# Patient Record
Sex: Female | Born: 2000 | Race: Black or African American | Hispanic: No | Marital: Single | Smoking: Never smoker
Health system: Southern US, Community
[De-identification: ages and names within clinical notes are randomized; demographics above are authoritative.]

## PROBLEM LIST (undated history)

## (undated) DIAGNOSIS — F32A Depression, unspecified: Secondary | ICD-10-CM

## (undated) DIAGNOSIS — E669 Obesity, unspecified: Secondary | ICD-10-CM

## (undated) DIAGNOSIS — J302 Other seasonal allergic rhinitis: Secondary | ICD-10-CM

## (undated) DIAGNOSIS — E119 Type 2 diabetes mellitus without complications: Secondary | ICD-10-CM

## (undated) DIAGNOSIS — F329 Major depressive disorder, single episode, unspecified: Secondary | ICD-10-CM

## (undated) DIAGNOSIS — L0292 Furuncle, unspecified: Secondary | ICD-10-CM

## (undated) HISTORY — DX: Type 2 diabetes mellitus without complications: E11.9

## (undated) HISTORY — DX: Other seasonal allergic rhinitis: J30.2

## (undated) HISTORY — DX: Obesity, unspecified: E66.9

---
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
## 2020-07-21 ENCOUNTER — Emergency Department (HOSPITAL_COMMUNITY)
Admission: EM | Admit: 2020-07-21 | Discharge: 2020-07-21 | Disposition: A | Discharge status: Home / Self Care | Payer: Medicaid Other | Attending: Emergency Medicine | Admitting: Emergency Medicine

## 2020-07-21 ENCOUNTER — Other Ambulatory Visit: Payer: Self-pay

## 2020-07-21 DIAGNOSIS — Z7722 Contact with and (suspected) exposure to environmental tobacco smoke (acute) (chronic): Secondary | ICD-10-CM | POA: Diagnosis not present

## 2020-07-21 DIAGNOSIS — K0889 Other specified disorders of teeth and supporting structures: Secondary | ICD-10-CM | POA: Diagnosis present

## 2020-07-21 DIAGNOSIS — E1169 Type 2 diabetes mellitus with other specified complication: Secondary | ICD-10-CM | POA: Diagnosis not present

## 2020-07-21 DIAGNOSIS — E669 Obesity, unspecified: Secondary | ICD-10-CM | POA: Diagnosis not present

## 2020-07-21 MED ORDER — HYDROCODONE-ACETAMINOPHEN 5-325 MG PO TABS: 2.0000 | ORAL_TABLET | Freq: Four times a day (QID) | ORAL | 0 refills | Status: AC | PRN

## 2020-07-21 MED ORDER — IBUPROFEN 800 MG PO TABS: 800.0000 mg | ORAL_TABLET | Freq: Three times a day (TID) | ORAL | 0 refills | Status: AC | PRN

## 2020-07-21 MED ORDER — AMOXICILLIN 500 MG PO CAPS: 500.0000 mg | ORAL_CAPSULE | Freq: Three times a day (TID) | ORAL | 0 refills | Status: DC

## 2020-07-21 MED ORDER — IBUPROFEN 800 MG PO TABS
800.0000 mg | ORAL_TABLET | Freq: Once | ORAL | Status: AC
Start: 2020-07-21 — End: 2020-07-21
  Administered 2020-07-21: 800 mg via ORAL
  Filled 2020-07-21: qty 1

## 2020-07-21 MED ORDER — HYDROCODONE-ACETAMINOPHEN 5-325 MG PO TABS
1.0000 | ORAL_TABLET | Freq: Once | ORAL | Status: AC
Start: 2020-07-21 — End: 2020-07-21
  Administered 2020-07-21: 1 via ORAL
  Filled 2020-07-21: qty 1

## 2020-07-21 NOTE — Discharge Instructions (Signed)
Return if any problems.

## 2020-07-21 NOTE — ED Triage Notes (Signed)
Patient presents to ED with oral swelling to right side of face. Started at 2 am. Pain is 10/10

## 2020-07-21 NOTE — ED Provider Notes (Signed)
[facility]-EMERGENCY DEPT Provider Note   CSN: [id number] Arrival date & time: 07/21/20  1135     History Chief Complaint  Patient presents with  . Dental Pain    Kendra Williams is a 20 y.o. female.  The history is provided by the patient. No language interpreter was used.  Dental Pain Location:  Upper Quality:  Aching Severity:  Moderate Onset quality:  Gradual Timing:  Constant Progression:  Worsening Chronicity:  New Relieved by:  Nothing Pt complains of pain on the right side of her face.       Past Medical History:  Diagnosis Date  . Boil   . Depression   . Diabetes mellitus without complication (HCC)   . Obesity   . Seasonal allergies     Patient Active Problem List   Diagnosis Date Noted  . Hallucinations 05/28/2016  . MDD (major depressive disorder) 05/27/2016  . Suicide ideation   . MDD (major depressive disorder), recurrent episode, severe (HCC) 04/04/2016  . Elevated hemoglobin A1c 08/07/2015  . Acanthosis 08/07/2015  . Hypovitaminosis D 08/07/2015  . Morbid childhood obesity with BMI greater than 99th percentile for age [facility]) 08/07/2015    No past surgical history on file.   OB History   No obstetric history on file.     Family History  Problem Relation Age of Onset  . Hypertension Paternal Grandmother   . Obesity Father   . Hypertension Father   . Diabetes Father   . Gout Father   . Cancer Paternal Grandfather   . Diabetes Paternal Grandfather   . Diabetes Paternal Uncle   . Diabetes Maternal Grandmother     Social History   Tobacco Use  . Smoking status: Passive Smoke Exposure - Never Smoker  . Smokeless tobacco: Never Used  Substance Use Topics  . Alcohol use: No    Comment: denies  . Drug use: No    Comment: denies    Home Medications Prior to Admission medications   Medication Sig Start Date End Date Taking? Authorizing Provider  amoxicillin (AMOXIL) 500 MG capsule Take 1 capsule (500 mg total)  by mouth 3 (three) times daily. 07/21/20  Yes Elson Areas, PA-C  HYDROcodone-acetaminophen (NORCO/VICODIN) 5-325 MG tablet Take 2 tablets by mouth every 6 (six) hours as needed for moderate pain. 07/21/20 07/21/21 Yes Elson Areas, PA-C  ibuprofen (ADVIL) 800 MG tablet Take 1 tablet (800 mg total) by mouth every 8 (eight) hours as needed. 07/21/20  Yes Elson Areas, PA-C  CLINDAMAX 1 % gel Apply topically 2 (two) times daily. Apply topically twice a day to affected area 04/10/16   Denzil Magnuson, NP  lurasidone (LATUDA) 40 MG TABS tablet Take 1 tablet (40 mg total) by mouth daily with breakfast. 05/31/16   Maryagnes Amos, FNP  sertraline (ZOLOFT) 25 MG tablet Take 3 tablets (75 mg total) by mouth daily. 05/31/16   Starkes-Perry, Juel Burrow, FNP  traZODone (DESYREL) 50 MG tablet Take 1 tablet (50 mg total) by mouth at bedtime as needed for sleep. 05/30/16   Starkes-Perry, Juel Burrow, FNP    Allergies    Peach [prunus persica]  Review of Systems   Review of Systems  All other systems reviewed and are negative.   Physical Exam Updated Vital Signs BP 135/79   Pulse 73   Temp 98.2 F (36.8 C) (Oral)   Resp 18   Ht 5\' 7"  (1.702 m)   SpO2 97%   Physical Exam Vitals  and nursing note reviewed.  Constitutional:      Appearance: Normal appearance.  HENT:     Mouth/Throat:     Mouth: Mucous membranes are moist.  Eyes:     Pupils: Pupils are equal, round, and reactive to light.  Cardiovascular:     Rate and Rhythm: Normal rate.  Musculoskeletal:        General: Normal range of motion.  Skin:    General: Skin is warm.  Neurological:     General: No focal deficit present.     Mental Status: She is alert.  Psychiatric:        Mood and Affect: Mood normal.     ED Results / Procedures / Treatments   Labs (all labs ordered are listed, but only abnormal results are displayed) Labs Reviewed - No data to display  EKG None  Radiology No results found.  Procedures Procedures    Medications Ordered in ED Medications  ibuprofen (ADVIL) tablet 800 mg (800 mg Oral Given 07/21/20 1251)  HYDROcodone-acetaminophen (NORCO/VICODIN) 5-325 MG per tablet 1 tablet (1 tablet Oral Given 07/21/20 1251)    ED Course  I have reviewed the triage vital signs and the nursing notes.  Pertinent labs & imaging results that were available during my care of the patient were reviewed by me and considered in my medical decision making (see chart for details).    MDM Rules/Calculators/A&P                          MDM:  Pt reports pain improved with hydrocodone and ibuprofen.  Pt given rx for dental evaluation  Final Clinical Impression(s) / ED Diagnoses Final diagnoses:  Pain, dental   An After Visit Summary was printed and given to the patient. Rx / DC Orders ED Discharge Orders         Ordered    amoxicillin (AMOXIL) 500 MG capsule  3 times daily        07/21/20 1329    HYDROcodone-acetaminophen (NORCO/VICODIN) 5-325 MG tablet  Every 6 hours PRN        07/21/20 1329    ibuprofen (ADVIL) 800 MG tablet  Every 8 hours PRN        07/21/20 1329        An After Visit Summary was printed and given to the patient.    Osie Cheeks 07/21/20 1430    Gerhard Munch, MD 07/23/20 2330

## 2020-09-13 ENCOUNTER — Other Ambulatory Visit: Payer: Self-pay

## 2020-09-13 ENCOUNTER — Emergency Department (HOSPITAL_COMMUNITY)
Admission: EM | Admit: 2020-09-13 | Discharge: 2020-09-13 | Disposition: A | Discharge status: Home / Self Care | Payer: Medicaid Other | Attending: Emergency Medicine | Admitting: Emergency Medicine

## 2020-09-13 ENCOUNTER — Encounter (HOSPITAL_COMMUNITY): Payer: Self-pay

## 2020-09-13 DIAGNOSIS — J029 Acute pharyngitis, unspecified: Secondary | ICD-10-CM | POA: Diagnosis present

## 2020-09-13 DIAGNOSIS — E119 Type 2 diabetes mellitus without complications: Secondary | ICD-10-CM | POA: Diagnosis not present

## 2020-09-13 DIAGNOSIS — Z87891 Personal history of nicotine dependence: Secondary | ICD-10-CM | POA: Diagnosis not present

## 2020-09-13 DIAGNOSIS — R519 Headache, unspecified: Secondary | ICD-10-CM | POA: Insufficient documentation

## 2020-09-13 LAB — GROUP A STREP BY PCR: Group A Strep by PCR: NOT DETECTED

## 2020-09-13 MED ORDER — ACETAMINOPHEN 500 MG PO TABS
1000.0000 mg | ORAL_TABLET | Freq: Once | ORAL | Status: AC
  Administered 2020-09-13: 1000 mg via ORAL
  Filled 2020-09-13: qty 2

## 2020-09-13 NOTE — ED Triage Notes (Signed)
Patient c/o migraine, sore throat x 2 days and states she vomited for an hour yesterday, but none today.

## 2020-09-13 NOTE — Discharge Instructions (Addendum)
  You were evaluated in the Emergency Department and after careful evaluation, we did not find any emergent condition requiring admission or further testing in the hospital.   Your exam/testing today was overall reassuring.  Symptoms seem to be due to a viral cold sore throat.  As we discussed I want you to gargle salt water, take Tylenol every 6-8 hours as directed on the bottle for the next couple of days.  If you develop any new or worsening concerning symptoms as we discussed such as difficulty swallowing, drooling, difficulty opening your mouth or trouble breathing please come back to the emergency department.  Please follow-up with your primary care doctor in the next couple of days for a recheck of your symptoms or not getting better. Please return to the Emergency Department if you experience any worsening of your condition.  Thank you for allowing [place] to be a part of your care. Please speak to your pharmacist about any new medications prescribed today in regards to side effects or interactions with other medications.

## 2020-09-13 NOTE — ED Provider Notes (Signed)
[facility]-EMERGENCY DEPT Provider Note   CSN: [id number] Arrival date & time: 09/13/20  1119     History Chief Complaint  Patient presents with   Sore Throat   Migraine    Curt JewsCarla Aber is a 20 y.o. female with prior past medical history of diabetes and obesity who presents emerged department today for headache and sore throat for the past 3 days.  Patient states that her sore throat and headache are mild, states that her headache is a 4 out of 10, bilaterally.  Patient states that she has not taken anything for this, does have a history of migraines.  Patient states that she did vomit 1 time yesterday, has not felt nauseous since.  No vomiting today.  No diarrhea.  Denies any cough, fevers, myalgias, back pain, abdominal pain, chance of pregnancy.  Patient states that her best friend was just diagnosed with COVID 2 days ago, states that she is tested 3 times since then, PCR's which have been negative.  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  Denies any drooling, trismus, muffled voice, difficulty swallowing.  Patient is eating and drinking normally.  Denies any shortness of breath or chest pain.  Denies any facial pain or ear pain.Marland Kitchen.  HPI     Past Medical History:  Diagnosis Date   Boil    Depression    Diabetes mellitus without complication (HCC)    Obesity    Seasonal allergies     Patient Active Problem List   Diagnosis Date Noted   Hallucinations 05/28/2016   MDD (major depressive disorder) 05/27/2016   Suicide ideation    MDD (major depressive disorder), recurrent episode, severe (HCC) 04/04/2016   Elevated hemoglobin A1c 08/07/2015   Acanthosis 08/07/2015   Hypovitaminosis D 08/07/2015   Morbid childhood obesity with BMI greater than 99th percentile for age [facility](HCC) 08/07/2015    History reviewed. No pertinent surgical history.   OB History   No obstetric history on file.     Family History   Problem Relation Age of Onset   Hypertension Paternal Grandmother    Obesity Father    Hypertension Father    Diabetes Father    Gout Father    Cancer Paternal Grandfather    Diabetes Paternal Grandfather    Diabetes Paternal Uncle    Diabetes Maternal Grandmother     Social History   Tobacco Use   Smoking status: Never    Passive exposure: Yes   Smokeless tobacco: Never  Vaping Use   Vaping Use: Former  Substance Use Topics   Alcohol use: No    Comment: denies   Drug use: No    Comment: denies    Home Medications Prior to Admission medications   Medication Sig Start Date End Date Taking? Authorizing Provider  amoxicillin (AMOXIL) 500 MG capsule Take 1 capsule (500 mg total) by mouth 3 (three) times daily. 07/21/20   Elson AreasSofia, Leslie K, PA-C  CLINDAMAX 1 % gel Apply topically 2 (two) times daily. Apply topically twice a day to affected area 04/10/16   Denzil Magnusonhomas, Lashunda, NP  HYDROcodone-acetaminophen (NORCO/VICODIN) 5-325 MG tablet Take 2 tablets by mouth every 6 (six) hours as needed for moderate pain. 07/21/20 07/21/21  Elson AreasSofia, Leslie K, PA-C  ibuprofen (ADVIL) 800 MG tablet Take 1 tablet (800 mg total) by mouth every 8 (eight) hours as needed. 07/21/20   Elson AreasSofia, Leslie K, PA-C  lurasidone (LATUDA) 40 MG TABS tablet Take  1 tablet (40 mg total) by mouth daily with breakfast. 05/31/16   Maryagnes Amos, FNP  sertraline (ZOLOFT) 25 MG tablet Take 3 tablets (75 mg total) by mouth daily. 05/31/16   Starkes-Perry, Juel Burrow, FNP  traZODone (DESYREL) 50 MG tablet Take 1 tablet (50 mg total) by mouth at bedtime as needed for sleep. 05/30/16   Maryagnes Amos, FNP    Allergies    Other and Peach [prunus persica]  Review of Systems   Review of Systems  Constitutional:  Negative for chills, diaphoresis, fatigue and fever.  HENT:  Positive for sore throat. Negative for congestion, dental problem, drooling, ear discharge, ear pain, facial swelling, hearing loss, mouth sores, nosebleeds,  postnasal drip, sinus pressure and trouble swallowing.   Eyes:  Negative for pain and visual disturbance.  Respiratory:  Negative for cough, shortness of breath and wheezing.   Cardiovascular:  Negative for chest pain, palpitations and leg swelling.  Gastrointestinal:  Negative for abdominal distention, abdominal pain, diarrhea, nausea and vomiting.  Genitourinary:  Negative for difficulty urinating.  Musculoskeletal:  Negative for back pain, neck pain and neck stiffness.  Skin:  Negative for pallor.  Neurological:  Positive for headaches. Negative for dizziness, speech difficulty and weakness.  Psychiatric/Behavioral:  Negative for confusion.    Physical Exam Updated Vital Signs BP (!) 152/96 (BP Location: Right Arm)   Pulse (!) 106   Temp 99.4 F (37.4 C) (Oral)   Resp 18   Ht 5\' 6"  (1.676 m)   Wt 118.1 kg   LMP 08/23/2020 (Exact Date)   SpO2 97%   BMI 42.03 kg/m   Physical Exam Constitutional:      General: She is not in acute distress.    Appearance: Normal appearance. She is not ill-appearing, toxic-appearing or diaphoretic.     Comments: Patient without acute respiratory stress.  Patient is sitting comfortably in bed, no tripoding, use of accessory muscles.  Patient is speaking to me in full sentences.  Handling secretions well.  HENT:     Head: Normocephalic and atraumatic.     Jaw: There is normal jaw occlusion. No trismus, swelling or malocclusion.     Nose: No congestion or rhinorrhea.     Right Sinus: No maxillary sinus tenderness or frontal sinus tenderness.     Left Sinus: No maxillary sinus tenderness or frontal sinus tenderness.     Mouth/Throat:     Mouth: Mucous membranes are moist. No oral lesions.     Dentition: Normal dentition.     Tongue: No lesions.     Palate: No mass and lesions.     Pharynx: Oropharynx is clear. Uvula midline. No pharyngeal swelling, oropharyngeal exudate, posterior oropharyngeal erythema or uvula swelling.     Tonsils: No tonsillar  exudate or tonsillar abscesses. 1+ on the right. 1+ on the left.     Comments: Patient without tonsillar enlargement or exudate.  No signs of peritonsillar abscess, palate without any tenderness or masses palpated.  No swelling under the tongue, uvula is midline without any inflammation. Eyes:     General: No visual field deficit.       Right eye: No discharge.        Left eye: No discharge.     Extraocular Movements: Extraocular movements intact.     Conjunctiva/sclera: Conjunctivae normal.     Pupils: Pupils are equal, round, and reactive to light.  Cardiovascular:     Rate and Rhythm: Normal rate and regular rhythm.  Pulses: Normal pulses.     Heart sounds: Normal heart sounds. No murmur heard.   No friction rub. No gallop.  Pulmonary:     Effort: Pulmonary effort is normal. No respiratory distress.     Breath sounds: Normal breath sounds. No stridor. No wheezing, rhonchi or rales.  Chest:     Chest wall: No tenderness.  Abdominal:     General: Abdomen is flat. Bowel sounds are normal. There is no distension.     Palpations: Abdomen is soft.     Tenderness: There is no abdominal tenderness. There is no right CVA tenderness or left CVA tenderness.  Musculoskeletal:        General: No swelling or tenderness. Normal range of motion.     Cervical back: Normal range of motion. No rigidity or tenderness.     Right lower leg: No edema.     Left lower leg: No edema.  Lymphadenopathy:     Cervical: No cervical adenopathy.  Skin:    General: Skin is warm and dry.     Capillary Refill: Capillary refill takes less than 2 seconds.     Findings: No erythema or rash.  Neurological:     General: No focal deficit present.     Mental Status: She is alert and oriented to person, place, and time.     Cranial Nerves: Cranial nerves are intact. No cranial nerve deficit or facial asymmetry.     Motor: Motor function is intact. No weakness.     Coordination: Coordination is intact.     Gait:  Gait is intact. Gait normal.  Psychiatric:        Mood and Affect: Mood normal.    ED Results / Procedures / Treatments   Labs (all labs ordered are listed, but only abnormal results are displayed) Labs Reviewed  GROUP A STREP BY PCR    EKG None  Radiology No results found.  Procedures Procedures   Medications Ordered in ED Medications  acetaminophen (TYLENOL) tablet 1,000 mg (1,000 mg Oral Given 09/13/20 1232)    ED Course  I have reviewed the triage vital signs and the nursing notes.  Pertinent labs & imaging results that were available during my care of the patient were reviewed by me and considered in my medical decision making (see chart for details).    MDM Rules/Calculators/A&P                          Patient presents to the emerge department today for sore throat and headache.  Headache is mild, no red flag symptoms.  Most likely related to sore throat and upper respiratory infection.  Patient's throat looks benign, patient is very well-appearing.  No concerns for peritonsillar abscess, Ludwick's or deep space infection.  Patient rejecting IM Toradol, wants something p.o. for headache and sore throat.  Does not want to be tested for COVID since she is been tested 3 times.  Will test for strep at this time and give Tylenol and reevaluate.  Upon evaluation patient states that she feels much better, tachycardia has resolved.  Patient's strep test is negative.  Patient most likely has viral pharyngitis, did discuss this with patient, symptomatic treatment discussed.  Patient agreeable with plan, to be discharged at this time.  Did also discussed blood pressre control with patient, states that she will follow-up with PCP.  Doubt need for further emergent work up at this time. I explained the diagnosis and have  given explicit precautions to return to the ER including for any other new or worsening symptoms. The patient understands and accepts the medical plan as it's been  dictated and I have answered their questions. Discharge instructions concerning home care and prescriptions have been given. The patient is STABLE and is discharged to home in good condition.   Final Clinical Impression(s) / ED Diagnoses Final diagnoses:  Viral pharyngitis    Rx / DC Orders ED Discharge Orders     None        Farrel Gordon, PA-C 09/13/20 1334    Tilden Fossa, MD 09/15/20 [phone]

## 2020-10-02 ENCOUNTER — Ambulatory Visit
Admission: EM | Admit: 2020-10-02 | Discharge: 2020-10-02 | Disposition: A | Discharge status: Home / Self Care | Payer: Medicaid Other | Attending: Urgent Care | Admitting: Urgent Care

## 2020-10-02 ENCOUNTER — Encounter: Payer: Self-pay | Admitting: Urgent Care

## 2020-10-02 ENCOUNTER — Other Ambulatory Visit: Payer: Self-pay

## 2020-10-02 DIAGNOSIS — R112 Nausea with vomiting, unspecified: Secondary | ICD-10-CM

## 2020-10-02 DIAGNOSIS — N946 Dysmenorrhea, unspecified: Secondary | ICD-10-CM | POA: Diagnosis not present

## 2020-10-02 DIAGNOSIS — G43809 Other migraine, not intractable, without status migrainosus: Secondary | ICD-10-CM | POA: Diagnosis not present

## 2020-10-02 DIAGNOSIS — Z8742 Personal history of other diseases of the female genital tract: Secondary | ICD-10-CM

## 2020-10-02 DIAGNOSIS — B349 Viral infection, unspecified: Secondary | ICD-10-CM | POA: Diagnosis not present

## 2020-10-02 LAB — POCT URINE PREGNANCY: Preg Test, Ur: NEGATIVE

## 2020-10-02 MED ORDER — EXCEDRIN MIGRAINE 250-250-65 MG PO TABS: 1.0000 | ORAL_TABLET | Freq: Four times a day (QID) | ORAL | 0 refills | Status: AC | PRN

## 2020-10-02 MED ORDER — ONDANSETRON 8 MG PO TBDP
8.0000 mg | ORAL_TABLET | Freq: Once | ORAL | Status: AC
  Administered 2020-10-02: 8 mg via ORAL

## 2020-10-02 MED ORDER — ONDANSETRON 8 MG PO TBDP: 8.0000 mg | ORAL_TABLET | Freq: Three times a day (TID) | ORAL | 0 refills | Status: AC | PRN

## 2020-10-02 MED ORDER — KETOROLAC TROMETHAMINE 60 MG/2ML IM SOLN
60.0000 mg | Freq: Once | INTRAMUSCULAR | Status: AC
  Administered 2020-10-02: 60 mg via INTRAMUSCULAR

## 2020-10-02 NOTE — ED Provider Notes (Signed)
[facility]   MRN: [id number] DOB: 2000/07/05  Subjective:   Kendra Williams is a 20 y.o. female presenting for 2-day history of acute onset left-sided headache, nausea, vomiting, diarrhea, photophobia, malaise and fatigue.  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  She tested positive today.  Had a negative test yesterday but since she continued to feel ill, she did repeat test today.  She is COVID vaccinated with the booster.  Patient has a history of dysmenorrhea and irregular cycles.  She stopped taking her birth control months ago because of problems with breakthrough bleeding.  Has not seen a gynecologist since then.  She did have 2 cycles within the past month, is currently on her second one.  Reports that she has been bleeding profusely.  Prefers to see a gynecologist.  Regarding her headache, has typically not used any medications for migraines.  She does have a history of allergies but does not take anything consistently for this either.  Denies runny or stuffy nose, chest pain, shortness of breath, wheezing.  No recent antibiotics in the past 2 weeks.  No recent hospitalizations, long distance travel.   Current Facility-Administered Medications:    ketorolac (TORADOL) injection 60 mg, 60 mg, Intramuscular, Once, Wallis Bamberg, PA-C  Current Outpatient Medications:    HYDROcodone-acetaminophen (NORCO/VICODIN) 5-325 MG tablet, Take 2 tablets by mouth every 6 (six) hours as needed for moderate pain., Disp: 10 tablet, Rfl: 0   ibuprofen (ADVIL) 800 MG tablet, Take 1 tablet (800 mg total) by mouth every 8 (eight) hours as needed., Disp: 30 tablet, Rfl: 0   lurasidone (LATUDA) 40 MG TABS tablet, Take 1 tablet (40 mg total) by mouth daily with breakfast., Disp: 30 tablet, Rfl: 0   sertraline (ZOLOFT) 25 MG tablet, Take 3 tablets (75 mg total) by mouth daily., Disp: 30 tablet, Rfl: 0   traZODone (DESYREL) 50 MG tablet, Take 1 tablet (50 mg total) by  mouth at bedtime as needed for sleep., Disp: 30 tablet, Rfl: 0   Allergies  Allergen Reactions   Other     Peanuts, cats   Peach [Prunus Persica] Hives    Past Medical History:  Diagnosis Date   Boil    Depression    Diabetes mellitus without complication (HCC)    Obesity    Seasonal allergies      History reviewed. No pertinent surgical history.  Family History  Problem Relation Age of Onset   Hypertension Paternal Grandmother    Obesity Father    Hypertension Father    Diabetes Father    Gout Father    Cancer Paternal Grandfather    Diabetes Paternal Grandfather    Diabetes Paternal Uncle    Diabetes Maternal Grandmother     Social History   Tobacco Use   Smoking status: Never    Passive exposure: Yes   Smokeless tobacco: Never  Vaping Use   Vaping Use: Some days  Substance Use Topics   Alcohol use: No    Comment: denies   Drug use: No    Comment: denies    ROS   Objective:   Vitals: BP 116/81 (BP Location: Left Arm)   Pulse 70   Temp 98.5 F (36.9 C) (Oral)   Resp 20   SpO2 98%   Physical Exam Constitutional:      General: She is not in acute distress.    Appearance: Normal appearance. She is well-developed. She is not ill-appearing, toxic-appearing  or diaphoretic.  HENT:     Head: Normocephalic and atraumatic.     Right Ear: Tympanic membrane, ear canal and external ear normal. No drainage or tenderness. No middle ear effusion. There is no impacted cerumen. Tympanic membrane is not erythematous.     Left Ear: Tympanic membrane, ear canal and external ear normal. No drainage or tenderness.  No middle ear effusion. There is no impacted cerumen. Tympanic membrane is not erythematous.     Nose: Nose normal. No congestion or rhinorrhea.     Mouth/Throat:     Mouth: Mucous membranes are moist. No oral lesions.     Pharynx: Oropharynx is clear. No pharyngeal swelling, oropharyngeal exudate, posterior oropharyngeal erythema or uvula swelling.      Tonsils: No tonsillar exudate or tonsillar abscesses.  Eyes:     General: No scleral icterus.       Right eye: No discharge.        Left eye: No discharge.     Extraocular Movements: Extraocular movements intact.     Right eye: Normal extraocular motion.     Left eye: Normal extraocular motion.     Conjunctiva/sclera: Conjunctivae normal.     Pupils: Pupils are equal, round, and reactive to light.  Cardiovascular:     Rate and Rhythm: Normal rate and regular rhythm.     Pulses: Normal pulses.     Heart sounds: Normal heart sounds. No murmur heard.   No friction rub. No gallop.  Pulmonary:     Effort: Pulmonary effort is normal. No respiratory distress.     Breath sounds: Normal breath sounds. No stridor. No wheezing, rhonchi or rales.  Musculoskeletal:     Cervical back: Normal range of motion and neck supple.  Lymphadenopathy:     Cervical: No cervical adenopathy.  Skin:    General: Skin is warm and dry.     Findings: No rash.  Neurological:     General: No focal deficit present.     Mental Status: She is alert and oriented to person, place, and time.     Cranial Nerves: No cranial nerve deficit.     Motor: No weakness.     Coordination: Coordination normal.     Gait: Gait normal.     Deep Tendon Reflexes: Reflexes normal.  Psychiatric:        Mood and Affect: Mood normal.        Behavior: Behavior normal.        Thought Content: Thought content normal.        Judgment: Judgment normal.    Results for orders placed or performed during the hospital encounter of 10/02/20 (from the past 24 hour(s))  POCT urine pregnancy     Status: None   Collection Time: 10/02/20  9:25 AM  Result Value Ref Range   Preg Test, Ur Negative Negative   IM Toradol, p.o. Zofran in clinic.   Assessment and Plan :   PDMP not reviewed this encounter.  1. Viral syndrome   2. Other migraine without status migrainosus, not intractable   3. Nausea vomiting and diarrhea     Will manage for  viral illness such as viral URI, viral syndrome, viral rhinitis, COVID-19. Counseled patient on nature of COVID-19 including modes of transmission, diagnostic testing, management and supportive care.  Offered scripts for symptomatic relief. COVID 19 testing is pending.  Patient would be a good candidate to receive COVID antivirals given her medical history.  She would be able to tolerate  it with a creatinine level of 0.92 from 07/26/2020.  Recommended Excedrin for migraines, restart allergy medications.  Patient declined any treatment for her dysmenorrhea, provide her with information to a gynecologist to establish care and follow-up.  Counseled patient on potential for adverse effects with medications prescribed/recommended today, ER and return-to-clinic precautions discussed, patient verbalized understanding.     Wallis Bamberg, [place] 10/02/20 [phone]

## 2020-10-02 NOTE — ED Triage Notes (Signed)
Pt c/o n/v/d, headache and vaginal bleeding. States had a positive covid test at work.

## 2020-10-03 LAB — NOVEL CORONAVIRUS, NAA: SARS-CoV-2, NAA: NOT DETECTED

## 2020-10-03 LAB — SARS-COV-2, NAA 2 DAY TAT

## 2020-12-11 ENCOUNTER — Telehealth: Payer: Self-pay

## 2021-02-18 ENCOUNTER — Emergency Department (HOSPITAL_COMMUNITY)
Admission: EM | Admit: 2021-02-18 | Discharge: 2021-02-18 | Disposition: A | Discharge status: Home / Self Care | Payer: Medicaid Other | Attending: Emergency Medicine | Admitting: Emergency Medicine

## 2021-02-18 ENCOUNTER — Encounter (HOSPITAL_COMMUNITY): Payer: Self-pay

## 2021-02-18 DIAGNOSIS — Z20822 Contact with and (suspected) exposure to covid-19: Secondary | ICD-10-CM | POA: Insufficient documentation

## 2021-02-18 DIAGNOSIS — R110 Nausea: Secondary | ICD-10-CM | POA: Diagnosis present

## 2021-02-18 DIAGNOSIS — I10 Essential (primary) hypertension: Secondary | ICD-10-CM | POA: Diagnosis not present

## 2021-02-18 DIAGNOSIS — R5383 Other fatigue: Secondary | ICD-10-CM | POA: Diagnosis not present

## 2021-02-18 LAB — POC URINE PREG, ED: Preg Test, Ur: NEGATIVE

## 2021-02-18 LAB — RESP PANEL BY RT-PCR (FLU A&B, COVID) ARPGX2
Influenza A by PCR: NEGATIVE
Influenza B by PCR: NEGATIVE
SARS Coronavirus 2 by RT PCR: NEGATIVE

## 2021-02-18 NOTE — ED Triage Notes (Signed)
Patient arrived with complaints of body aches, nausea, and recent blood nose. States she had to call out of work and needs a Chiropractor note.

## 2021-02-18 NOTE — ED Notes (Signed)
Pt has urine in main lab

## 2021-02-18 NOTE — ED Provider Notes (Signed)
[facility] DEPT Provider Note   CSN: [id number] Arrival date & time: 02/18/21  0254     History  Chief Complaint  Patient presents with   Generalized Body Aches    Shamonte Pangan is a 21 y.o. female.  HPI  21 year old female with a history of anemia, obesity, presents to the emergency department today for evaluation of fatigue.  Patient states she has felt increased fatigue for the last 5 days.  She further reports nausea.  She is had a couple of nosebleeds which she attributes to sleeping with a fan and having a dry nose.  She denies any rhinorrhea, congestion, cough.  She states she felt like she was getting a common cold so she did not go to work however they stated that she needed to have a work note so that is why she is here.   Home Medications Prior to Admission medications   Medication Sig Start Date End Date Taking? Authorizing Provider  aspirin-acetaminophen-caffeine (EXCEDRIN MIGRAINE) [phone] MG tablet Take 1 tablet by mouth every 6 (six) hours as needed for headache or migraine. 10/02/20   Jaynee Eagles, PA-C  HYDROcodone-acetaminophen (NORCO/VICODIN) 5-325 MG tablet Take 2 tablets by mouth every 6 (six) hours as needed for moderate pain. 07/21/20 07/21/21  Fransico Meadow, PA-C  ibuprofen (ADVIL) 800 MG tablet Take 1 tablet (800 mg total) by mouth every 8 (eight) hours as needed. 07/21/20   Fransico Meadow, PA-C  lurasidone (LATUDA) 40 MG TABS tablet Take 1 tablet (40 mg total) by mouth daily with breakfast. 05/31/16   Starkes-Perry, Gayland Curry, FNP  ondansetron (ZOFRAN-ODT) 8 MG disintegrating tablet Take 1 tablet (8 mg total) by mouth every 8 (eight) hours as needed for nausea or vomiting. 10/02/20   Jaynee Eagles, PA-C  sertraline (ZOLOFT) 25 MG tablet Take 3 tablets (75 mg total) by mouth daily. 05/31/16   Starkes-Perry, Gayland Curry, FNP  traZODone (DESYREL) 50 MG tablet Take 1 tablet (50 mg total) by mouth at bedtime as needed for sleep. 05/30/16    Starkes-Perry, Gayland Curry, FNP      Allergies    Other and Peach [prunus persica]    Review of Systems   Review of Systems  Constitutional:  Positive for fatigue. Negative for fever.  HENT:  Negative for congestion, rhinorrhea and sore throat.   Respiratory:  Negative for cough and shortness of breath.   Cardiovascular:  Negative for chest pain.  Gastrointestinal:  Positive for nausea. Negative for abdominal pain, constipation, diarrhea and vomiting.  Genitourinary:  Negative for flank pain.  Musculoskeletal:  Negative for myalgias.  Skin:  Negative for rash.  Neurological:  Negative for headaches.   Physical Exam Updated Vital Signs BP (!) 172/114 (BP Location: Right Arm)    Pulse 87    Temp 97.9 F (36.6 C) (Oral)    Resp 18    Ht 5\' 6"  (1.676 m)    Wt 122.5 kg    SpO2 94%    BMI 43.58 kg/m  Physical Exam Vitals and nursing note reviewed.  Constitutional:      General: She is not in acute distress.    Appearance: She is well-developed.  HENT:     Head: Normocephalic and atraumatic.  Eyes:     Conjunctiva/sclera: Conjunctivae normal.  Cardiovascular:     Rate and Rhythm: Normal rate and regular rhythm.  Pulmonary:     Effort: Pulmonary effort is normal.     Breath sounds: Normal breath sounds.  Abdominal:  Palpations: Abdomen is soft.     Tenderness: There is no abdominal tenderness. There is no guarding or rebound.  Musculoskeletal:        General: Normal range of motion.     Cervical back: Neck supple.  Skin:    General: Skin is warm and dry.  Neurological:     Mental Status: She is alert.    ED Results / Procedures / Treatments   Labs (all labs ordered are listed, but only abnormal results are displayed) Labs Reviewed  RESP PANEL BY RT-PCR (FLU A&B, COVID) ARPGX2  POC URINE PREG, ED    EKG None  Radiology No results found.  Procedures Procedures    Medications Ordered in ED Medications - No data to display  ED Course/ Medical Decision Making/  A&P                           Medical Decision Making  21 year old female presents to the emergency department today requesting a work note.  She has been fatigued for the last 5 days as well as nauseated.  She has had no other GI or GU symptoms associated with this.  She denies any other URI symptoms, fevers or other concerns other than some intermittent light bleeding from her nose which is resolved at this time.  She has had a little bit of hypertension here but her vital signs are otherwise reassuring.  I doubt emergent cause of her symptoms.  Pregnancy test is negative.  Her COVID and flu are pending.  She is given a work note per her request.  Is advised to follow-up and return to the ED for new or worsening symptoms.  She voices understanding of plan and reasons to return.  All questions answered.  Patient stable for discharge.  Final Clinical Impression(s) / ED Diagnoses Final diagnoses:  Nausea    Rx / DC Orders ED Discharge Orders     None         Rodney Booze, PA-C 02/18/21 0336    Molpus, Jenny Reichmann, MD 02/18/21 [phone]

## 2021-02-18 NOTE — Discharge Instructions (Signed)
Your pregnancy test is negative  Your covid test will result on the next 2 hours. You can follow up on this on your mychart   Please follow up with your primary care provider within 5-7 days for re-evaluation of your symptoms. If you do not have a primary care provider, information for a healthcare clinic has been provided for you to make arrangements for follow up care. Please return to the emergency department for any new or worsening symptoms.

## 2022-02-19 ENCOUNTER — Encounter (HOSPITAL_COMMUNITY): Payer: Self-pay | Admitting: Registered Nurse

## 2022-02-19 ENCOUNTER — Ambulatory Visit (HOSPITAL_COMMUNITY)
Admission: EM | Admit: 2022-02-19 | Discharge: 2022-02-19 | Disposition: A | Discharge status: Home / Self Care | Payer: Medicaid Other | Attending: Registered Nurse | Admitting: Registered Nurse

## 2022-02-19 DIAGNOSIS — R45850 Homicidal ideations: Secondary | ICD-10-CM

## 2022-02-19 DIAGNOSIS — F331 Major depressive disorder, recurrent, moderate: Secondary | ICD-10-CM | POA: Diagnosis present

## 2022-02-19 DIAGNOSIS — F332 Major depressive disorder, recurrent severe without psychotic features: Secondary | ICD-10-CM | POA: Insufficient documentation
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
## 2022-02-19 NOTE — Discharge Summary (Signed)
Kendra Williams to be D/C'd Home per NP order. An After Visit Summary was printed and given to the patient by provider. Patient escorted out and D/C home via private auto.  [name]  02/19/2022 1:34 PM

## 2022-02-19 NOTE — Discharge Instructions (Addendum)
If need to be seen sooner than 03/14/2022 can come during walk in hours listed below   [facility]: Outpatient psychiatric Services  New Patient Assessment and Therapy Walk-in Monday thru Thursday 8:00 am first come first serve until slots are full Every Friday from 1:00 pm to 4:00 pm first come first serve until slots are full  New Patient Psychiatric Medication Management Monday thru Friday from 8:00 am to 11:00 am first come first served until slots are full  For all walk-ins we ask that you arrive by 7:15 am because patients will be seen in there order of arrival.   Availability is limited, and therefore you may not be seen on the same day that you walk in.  Our goal is to serve and meet the needs of our community to the best of our ability.

## 2022-03-14 ENCOUNTER — Ambulatory Visit (HOSPITAL_COMMUNITY): Payer: Medicaid Other | Admitting: Clinical

## 2022-03-14 ENCOUNTER — Telehealth (HOSPITAL_COMMUNITY): Payer: Self-pay | Admitting: Clinical

## 2022-03-14 NOTE — Telephone Encounter (Signed)
Therapist sent the client a text message link to her cell phone number on file for the scheduled virtual therapy appointment to initiate outpatient services.  Client did not check in using the link.  Therapist called the client via telephone.  Therapist was able to make contact with the client and explained to her about her appointment and what it was for.  Client reported she did not remember making or having this appointment.  Client reported she would like to call back and reschedule the appointment for another time.  Therapist provided the client with the office telephone number and information.  Client reported no concerns at this time.

## 2022-04-15 ENCOUNTER — Ambulatory Visit
Admission: EM | Admit: 2022-04-15 | Discharge: 2022-04-15 | Disposition: A | Discharge status: Home / Self Care | Payer: Medicaid Other | Attending: Urgent Care | Admitting: Urgent Care

## 2022-04-15 DIAGNOSIS — D649 Anemia, unspecified: Secondary | ICD-10-CM | POA: Insufficient documentation

## 2022-04-15 DIAGNOSIS — R102 Pelvic and perineal pain: Secondary | ICD-10-CM | POA: Insufficient documentation

## 2022-04-15 DIAGNOSIS — R531 Weakness: Secondary | ICD-10-CM | POA: Diagnosis present

## 2022-04-15 DIAGNOSIS — R519 Headache, unspecified: Secondary | ICD-10-CM | POA: Insufficient documentation

## 2022-04-15 LAB — POCT URINALYSIS DIP (MANUAL ENTRY)
Bilirubin, UA: NEGATIVE
Blood, UA: NEGATIVE
Glucose, UA: NEGATIVE mg/dL
Ketones, POC UA: NEGATIVE mg/dL
Nitrite, UA: NEGATIVE
Protein Ur, POC: NEGATIVE mg/dL
Spec Grav, UA: >=1.03 — AB (ref 1.010–1.025)
Urobilinogen, UA: 0.2 E.U./dL
pH, UA: 6 (ref 5.0–8.0)

## 2022-04-15 LAB — POCT URINE PREGNANCY: Preg Test, Ur: NEGATIVE

## 2022-04-15 MED ORDER — NAPROXEN 500 MG PO TABS: 500.0000 mg | ORAL_TABLET | Freq: Two times a day (BID) | ORAL | 0 refills | Status: AC

## 2022-04-15 NOTE — ED Triage Notes (Signed)
Pt c/o "my body don't feel right-feel weak" x 2-3 weeks-intermittent vaginal pain-sx started 2 days after sexual intercourse with a new partner-NAD-steady gait

## 2022-04-15 NOTE — ED Provider Notes (Signed)
[facility] - URGENT CARE CENTER  Note:  This document was prepared using Systems analyst and may include unintentional dictation errors.  MRN: [id number] DOB: 2000-12-21  Subjective:   Kendra Williams is a 22 y.o. female presenting for 2 to 3-week history of persistent intermittent headaches, fatigue, weakness.  Symptoms started 2 to 3 days after having unprotected sex.  She is also had vaginal pain and discomfort. Denies fever, n/v, abdominal pain, pelvic pain, rashes, dysuria, urinary frequency, hematuria, vaginal discharge.  Hydrates very well with plain water.  Has a history of anemia.  Is not taking an iron supplement.   No current facility-administered medications for this encounter.  Current Outpatient Medications:    aspirin-acetaminophen-caffeine (EXCEDRIN MIGRAINE) 250-250-65 MG tablet, Take 1 tablet by mouth every 6 (six) hours as needed for headache or migraine., Disp: 30 tablet, Rfl: 0   ibuprofen (ADVIL) 800 MG tablet, Take 1 tablet (800 mg total) by mouth every 8 (eight) hours as needed., Disp: 30 tablet, Rfl: 0   lurasidone (LATUDA) 40 MG TABS tablet, Take 1 tablet (40 mg total) by mouth daily with breakfast., Disp: 30 tablet, Rfl: 0   ondansetron (ZOFRAN-ODT) 8 MG disintegrating tablet, Take 1 tablet (8 mg total) by mouth every 8 (eight) hours as needed for nausea or vomiting., Disp: 20 tablet, Rfl: 0   sertraline (ZOLOFT) 25 MG tablet, Take 3 tablets (75 mg total) by mouth daily., Disp: 30 tablet, Rfl: 0   traZODone (DESYREL) 50 MG tablet, Take 1 tablet (50 mg total) by mouth at bedtime as needed for sleep., Disp: 30 tablet, Rfl: 0   Allergies  Allergen Reactions   Other     Peanuts, cats   Peach [Prunus Persica] Hives    Past Medical History:  Diagnosis Date   Boil    Depression    Diabetes mellitus without complication ([place])    Obesity    Seasonal allergies     History reviewed. No pertinent surgical history.  Family History  Problem  Relation Age of Onset   Hypertension Paternal Grandmother    Obesity Father    Hypertension Father    Diabetes Father    Gout Father    Cancer Paternal Grandfather    Diabetes Paternal Grandfather    Diabetes Paternal Uncle    Diabetes Maternal Grandmother     Social History   Tobacco Use   Smoking status: Never    Passive exposure: Yes   Smokeless tobacco: Never  Vaping Use   Vaping Use: Former  Substance Use Topics   Alcohol use: No   Drug use: Yes    Types: Marijuana    ROS   Objective:   Vitals: BP (!) 136/95 (BP Location: Right Arm)   Pulse 70   Temp 98.5 F (36.9 C) (Oral)   Resp 20   LMP 04/08/2022   SpO2 98%   Physical Exam Constitutional:      General: She is not in acute distress.    Appearance: Normal appearance. She is well-developed and normal weight. She is not ill-appearing, toxic-appearing or diaphoretic.  HENT:     Head: Normocephalic and atraumatic.     Right Ear: Tympanic membrane, ear canal and external ear normal. No drainage or tenderness. No middle ear effusion. There is no impacted cerumen. Tympanic membrane is not erythematous or bulging.     Left Ear: Tympanic membrane, ear canal and external ear normal. No drainage or tenderness.  No middle ear effusion. There is no impacted  cerumen. Tympanic membrane is not erythematous or bulging.     Nose: Nose normal. No congestion or rhinorrhea.     Mouth/Throat:     Mouth: Mucous membranes are moist. No oral lesions.     Pharynx: No pharyngeal swelling, oropharyngeal exudate, posterior oropharyngeal erythema or uvula swelling.     Tonsils: No tonsillar exudate or tonsillar abscesses.  Eyes:     General: No scleral icterus.       Right eye: No discharge.        Left eye: No discharge.     Extraocular Movements: Extraocular movements intact.     Right eye: Normal extraocular motion.     Left eye: Normal extraocular motion.     Conjunctiva/sclera: Conjunctivae normal.  Neck:     Meningeal:  Brudzinski's sign and Kernig's sign absent.  Cardiovascular:     Rate and Rhythm: Normal rate and regular rhythm.     Heart sounds: Normal heart sounds. No murmur heard.    No friction rub. No gallop.  Pulmonary:     Effort: Pulmonary effort is normal. No respiratory distress.     Breath sounds: No stridor. No wheezing, rhonchi or rales.  Chest:     Chest wall: No tenderness.  Abdominal:     General: Bowel sounds are normal. There is no distension.     Palpations: Abdomen is soft. There is no mass.     Tenderness: There is abdominal tenderness (mild llq, right pelvic). There is no right CVA tenderness, left CVA tenderness, guarding or rebound.  Musculoskeletal:     Cervical back: Normal range of motion and neck supple.  Lymphadenopathy:     Cervical: No cervical adenopathy.  Skin:    General: Skin is warm and dry.  Neurological:     General: No focal deficit present.     Mental Status: She is alert and oriented to person, place, and time.     Cranial Nerves: No cranial nerve deficit, dysarthria or facial asymmetry.     Motor: No weakness or pronator drift.     Coordination: Romberg sign negative. Coordination normal. Finger-Nose-Finger Test and Heel to [facility] Test normal. Rapid alternating movements normal.     Gait: Gait and tandem walk normal.     Deep Tendon Reflexes: Reflexes normal.  Psychiatric:        Mood and Affect: Mood normal.        Behavior: Behavior normal.        Thought Content: Thought content normal.        Judgment: Judgment normal.     Results for orders placed or performed during the hospital encounter of 04/15/22 (from the past 24 hour(s))  POCT urine pregnancy     Status: None   Collection Time: 04/15/22  1:27 PM  Result Value Ref Range   Preg Test, Ur Negative Negative  POCT urinalysis dipstick     Status: Abnormal   Collection Time: 04/15/22  1:27 PM  Result Value Ref Range   Color, UA yellow yellow   Clarity, UA clear clear   Glucose, UA negative  negative mg/dL   Bilirubin, UA negative negative   Ketones, POC UA negative negative mg/dL   Spec Grav, UA >=1.030 (A) 1.010 - 1.025   Blood, UA negative negative   pH, UA 6.0 5.0 - 8.0   Protein Ur, POC negative negative mg/dL   Urobilinogen, UA 0.2 0.2 or 1.0 E.U./dL   Nitrite, UA Negative Negative   Leukocytes, UA Trace (A)  Negative    Assessment and Plan :   PDMP not reviewed this encounter.  1. Vaginal pain   2. Anemia, unspecified type   3. Weakness   4. Acute nonintractable headache, unspecified headache type     No signs of an acute encephalopathy, acute gynecologic emergency.  Recommended treating based off of results.  Naproxen for pain and inflammation. Counseled patient on potential for adverse effects with medications prescribed/recommended today, ER and return-to-clinic precautions discussed, patient verbalized understanding.    Jaynee Eagles, PA-C 04/15/22 1352

## 2022-04-16 LAB — CERVICOVAGINAL ANCILLARY ONLY
Bacterial Vaginitis (gardnerella): NEGATIVE
Candida Glabrata: NEGATIVE
Candida Vaginitis: POSITIVE — AB
Chlamydia: NEGATIVE
Comment: NEGATIVE
Comment: NEGATIVE
Comment: NEGATIVE
Comment: NEGATIVE
Comment: NEGATIVE
Comment: NORMAL
Neisseria Gonorrhea: NEGATIVE
Trichomonas: POSITIVE — AB

## 2022-04-16 LAB — BASIC METABOLIC PANEL
BUN/Creatinine Ratio: 18 (ref 9–23)
BUN: 14 mg/dL (ref 6–20)
CO2: 22 mmol/L (ref 20–29)
Calcium: 8.9 mg/dL (ref 8.7–10.2)
Chloride: 106 mmol/L (ref 96–106)
Creatinine, Ser: 0.76 mg/dL (ref 0.57–1.00)
Glucose: 95 mg/dL (ref 70–99)
Potassium: 4.3 mmol/L (ref 3.5–5.2)
Sodium: 139 mmol/L (ref 134–144)
eGFR: 114 mL/min/{1.73_m2} (ref >59)

## 2022-04-16 LAB — CBC
Hematocrit: 35.7 % (ref 34.0–46.6)
Hemoglobin: 11.1 g/dL (ref 11.1–15.9)
MCH: 26.2 pg — ABNORMAL LOW (ref 26.6–33.0)
MCHC: 31.1 g/dL — ABNORMAL LOW (ref 31.5–35.7)
MCV: 84 fL (ref 79–97)
Platelets: 250 10*3/uL (ref 150–450)
RBC: 4.23 x10E6/uL (ref 3.77–5.28)
RDW: 16.5 % — ABNORMAL HIGH (ref 11.7–15.4)
WBC: 8.3 10*3/uL (ref 3.4–10.8)

## 2022-04-17 ENCOUNTER — Telehealth: Payer: Self-pay

## 2022-04-17 MED ORDER — FLUCONAZOLE 150 MG PO TABS: 150.0000 mg | ORAL_TABLET | Freq: Every day | ORAL | 0 refills | Status: AC

## 2022-04-17 MED ORDER — METRONIDAZOLE 500 MG PO TABS: 500.0000 mg | ORAL_TABLET | Freq: Two times a day (BID) | ORAL | 0 refills | Status: AC

## 2022-08-05 ENCOUNTER — Other Ambulatory Visit: Payer: Self-pay

## 2022-08-05 ENCOUNTER — Emergency Department (HOSPITAL_BASED_OUTPATIENT_CLINIC_OR_DEPARTMENT_OTHER)
Admission: EM | Admit: 2022-08-05 | Discharge: 2022-08-05 | Disposition: A | Discharge status: Home / Self Care | Payer: Medicaid Other | Attending: Emergency Medicine | Admitting: Emergency Medicine

## 2022-08-05 ENCOUNTER — Encounter (HOSPITAL_BASED_OUTPATIENT_CLINIC_OR_DEPARTMENT_OTHER): Payer: Self-pay | Admitting: Urology

## 2022-08-05 DIAGNOSIS — R519 Headache, unspecified: Secondary | ICD-10-CM | POA: Insufficient documentation

## 2022-08-05 MED ORDER — SODIUM CHLORIDE 0.9 % IV BOLUS
1000.0000 mL | Freq: Once | INTRAVENOUS | Status: AC
  Administered 2022-08-05: 1000 mL via INTRAVENOUS

## 2022-08-05 MED ORDER — PROCHLORPERAZINE EDISYLATE 10 MG/2ML IJ SOLN
10.0000 mg | Freq: Once | INTRAMUSCULAR | Status: AC
  Administered 2022-08-05: 10 mg via INTRAVENOUS
  Filled 2022-08-05: qty 2

## 2022-08-05 MED ORDER — KETOROLAC TROMETHAMINE 15 MG/ML IJ SOLN
15.0000 mg | Freq: Once | INTRAMUSCULAR | Status: AC
  Administered 2022-08-05: 15 mg via INTRAVENOUS
  Filled 2022-08-05: qty 1

## 2022-08-05 NOTE — ED Provider Notes (Signed)
Signout from [place] PA-C at shift change. Briefly, patient presents for headache.  No red flags, although current headache is worse than typical headache.   Plan: Follow-up and recheck after migraine cocktail   4:28 PM Reassessment performed. Patient appears stable, sleeping.  She states that her headache is resolved.  Most current vital signs reviewed and are as follows: BP (!) 151/91 (BP Location: Left Arm)   Pulse 63   Temp 98.2 F (36.8 C) (Oral)   Resp 18   Ht 5\' 6"  (1.676 m)   Wt 119.3 kg   LMP 08/05/2022   SpO2 100%   BMI 42.45 kg/m   Plan: Discharged home   Home treatment: Rest, avoidance of triggers, hydrate well   Return and follow-up instructions: Patient counseled to return if they have weakness in their arms or legs, slurred speech, trouble walking or talking, confusion, trouble with their balance, or if they have any other concerns. Patient verbalizes understanding and agrees with plan.   Encouraged patient to follow-up with their provider in 5 days, especially if symptoms are recurrent. Patient verbalized understanding and agreed with plan.         Renne Crigler, PA-C 08/05/22 1630    Tegeler, Canary Brim, MD 08/05/22 [phone]

## 2022-08-05 NOTE — Discharge Instructions (Addendum)
You received migraine cocktail in the emergency room.  Headache improved.  Follow-up with the primary care provider I have attached above for you.  Do not take at 1000 mg of ibuprofen.  You can take up to 600 mg or at most 800.  Combine this with Tylenol if need be up to 1000 mg 3 times a day.  For any concerning symptoms return to the emergency room.

## 2022-08-05 NOTE — ED Provider Notes (Signed)
[facility] Provider Note   CSN: [id number] Arrival date & time: 08/05/22  1241     History  Chief Complaint  Patient presents with   Headache    Kendra Williams is a 22 y.o. female.  22 year old female presents today with migraine.  She states she occasionally gets headaches however not this bad.  She states this started Saturday and have slowly worsened since then.  This was not a sudden onset of headache.  Does have photophobia and sensitivity to sound.  No nausea or vomiting.  Has had ibuprofen with some relief.  She states she is taking the 1000 g of ibuprofen.  We discussed that she should not do this and take at maximum 600 or 800 mg.  Does not have PCP.  The history is provided by the patient. No language interpreter was used.       Home Medications Prior to Admission medications   Medication Sig Start Date End Date Taking? Authorizing Provider  aspirin-acetaminophen-caffeine (EXCEDRIN MIGRAINE) [phone] MG tablet Take 1 tablet by mouth every 6 (six) hours as needed for headache or migraine. 10/02/20   Wallis Bamberg, PA-C  fluconazole (DIFLUCAN) 150 MG tablet Take 1 tablet (150 mg total) by mouth daily. 04/17/22   Merrilee Jansky, MD  ibuprofen (ADVIL) 800 MG tablet Take 1 tablet (800 mg total) by mouth every 8 (eight) hours as needed. 07/21/20   Elson Areas, PA-C  lurasidone (LATUDA) 40 MG TABS tablet Take 1 tablet (40 mg total) by mouth daily with breakfast. 05/31/16   Starkes-Perry, Juel Burrow, FNP  metroNIDAZOLE (FLAGYL) 500 MG tablet Take 1 tablet (500 mg total) by mouth 2 (two) times daily. 04/17/22   Merrilee Jansky, MD  naproxen (NAPROSYN) 500 MG tablet Take 1 tablet (500 mg total) by mouth 2 (two) times daily with a meal. 04/15/22   Wallis Bamberg, PA-C  ondansetron (ZOFRAN-ODT) 8 MG disintegrating tablet Take 1 tablet (8 mg total) by mouth every 8 (eight) hours as needed for nausea or vomiting. 10/02/20   Wallis Bamberg, PA-C   sertraline (ZOLOFT) 25 MG tablet Take 3 tablets (75 mg total) by mouth daily. 05/31/16   Starkes-Perry, Juel Burrow, FNP  traZODone (DESYREL) 50 MG tablet Take 1 tablet (50 mg total) by mouth at bedtime as needed for sleep. 05/30/16   Starkes-Perry, Juel Burrow, FNP      Allergies    Other and Peach [prunus persica]    Review of Systems   Review of Systems  Constitutional:  Negative for fever.  Eyes:  Positive for photophobia. Negative for visual disturbance.  Respiratory:  Negative for cough.   Neurological:  Positive for headaches.  All other systems reviewed and are negative.   Physical Exam Updated Vital Signs BP (!) 151/91 (BP Location: Left Arm)   Pulse 63   Temp 98.2 F (36.8 C) (Oral)   Resp 18   Ht 5\' 6"  (1.676 m)   Wt 119.3 kg   LMP 08/05/2022   SpO2 100%   BMI 42.45 kg/m  Physical Exam Vitals and nursing note reviewed.  Constitutional:      General: She is not in acute distress.    Appearance: Normal appearance. She is not ill-appearing.  HENT:     Head: Normocephalic and atraumatic.     Nose: Nose normal.  Eyes:     Conjunctiva/sclera: Conjunctivae normal.  Cardiovascular:     Rate and Rhythm: Normal rate and regular rhythm.  Pulmonary:  Effort: Pulmonary effort is normal. No respiratory distress.  Musculoskeletal:        General: No deformity. Normal range of motion.     Cervical back: Normal range of motion.  Skin:    Findings: No rash.  Neurological:     General: No focal deficit present.     Mental Status: She is alert and oriented to person, place, and time. Mental status is at baseline.     Cranial Nerves: No cranial nerve deficit.     Comments: Cranial nerves III through XII intact.  Full range of motion bilateral upper and lower extremities and 5/5 strength in extensor and flexor muscle groups.     ED Results / Procedures / Treatments   Labs (all labs ordered are listed, but only abnormal results are displayed) Labs Reviewed - No data to  display  EKG None  Radiology No results found.  Procedures Procedures    Medications Ordered in ED Medications  ketorolac (TORADOL) 15 MG/ML injection 15 mg (has no administration in time range)  prochlorperazine (COMPAZINE) injection 10 mg (has no administration in time range)  sodium chloride 0.9 % bolus 1,000 mL (1,000 mLs Intravenous New Bag/Given 08/05/22 1404)    ED Course/ Medical Decision Making/ A&P                             Medical Decision Making Risk Prescription drug management.   22 year old female presents today for evaluation of headache.  Ongoing since Saturday.  Somewhat improved with ibuprofen.  Does have history of headaches.  Neurological exam without focal deficits.  Will provide migraine cocktail and reevaluate.  Patient at the end of my shift signout to oncoming provider.  If patient has improvement in symptoms on reevaluation she is appropriate for discharge and follow-up with PCP.  Referral attached to her dispo page.   Final Clinical Impression(s) / ED Diagnoses Final diagnoses:  Nonintractable headache, unspecified chronicity pattern, unspecified headache type    Rx / DC Orders ED Discharge Orders     None         Marita Kansas, PA-C 08/05/22 1408    Arby Barrette, MD 08/10/22 1511

## 2022-08-05 NOTE — ED Triage Notes (Signed)
Pt states bad migraine since Saturday  Sensitive to light and sound  Denies N/V  Had Ibuprofen 2 hrs PTA with no relief

## 2022-08-10 ENCOUNTER — Other Ambulatory Visit: Payer: Self-pay

## 2022-08-10 ENCOUNTER — Encounter (HOSPITAL_BASED_OUTPATIENT_CLINIC_OR_DEPARTMENT_OTHER): Payer: Self-pay

## 2022-08-10 DIAGNOSIS — Z7722 Contact with and (suspected) exposure to environmental tobacco smoke (acute) (chronic): Secondary | ICD-10-CM | POA: Diagnosis not present

## 2022-08-10 DIAGNOSIS — G43901 Migraine, unspecified, not intractable, with status migrainosus: Secondary | ICD-10-CM | POA: Insufficient documentation

## 2022-08-10 DIAGNOSIS — E119 Type 2 diabetes mellitus without complications: Secondary | ICD-10-CM | POA: Diagnosis not present

## 2022-08-10 DIAGNOSIS — R519 Headache, unspecified: Secondary | ICD-10-CM | POA: Diagnosis present

## 2022-08-10 NOTE — ED Triage Notes (Signed)
Pt states she has a migraine Was seen on 08/05/22 States they are worse and states she has not slept since the 18th because she is "scared"

## 2022-08-11 ENCOUNTER — Emergency Department (HOSPITAL_BASED_OUTPATIENT_CLINIC_OR_DEPARTMENT_OTHER): Payer: Medicaid Other

## 2022-08-11 ENCOUNTER — Encounter (HOSPITAL_BASED_OUTPATIENT_CLINIC_OR_DEPARTMENT_OTHER): Payer: Self-pay | Admitting: Emergency Medicine

## 2022-08-11 ENCOUNTER — Emergency Department (HOSPITAL_BASED_OUTPATIENT_CLINIC_OR_DEPARTMENT_OTHER)
Admission: EM | Admit: 2022-08-11 | Discharge: 2022-08-11 | Disposition: A | Discharge status: Home / Self Care | Payer: Medicaid Other | Attending: Emergency Medicine | Admitting: Emergency Medicine

## 2022-08-11 DIAGNOSIS — G43901 Migraine, unspecified, not intractable, with status migrainosus: Secondary | ICD-10-CM

## 2022-08-11 HISTORY — DX: Type 2 diabetes mellitus without complications: E11.9

## 2022-08-11 MED ORDER — DEXAMETHASONE SODIUM PHOSPHATE 10 MG/ML IJ SOLN
10.0000 mg | Freq: Once | INTRAMUSCULAR | Status: DC
  Filled 2022-08-11: qty 1

## 2022-08-11 MED ORDER — DROPERIDOL 2.5 MG/ML IJ SOLN
2.5000 mg | Freq: Once | INTRAMUSCULAR | Status: AC
  Administered 2022-08-11: 2.5 mg via INTRAVENOUS
  Filled 2022-08-11: qty 2

## 2022-08-11 MED ORDER — DIPHENHYDRAMINE HCL 50 MG/ML IJ SOLN
25.0000 mg | Freq: Once | INTRAMUSCULAR | Status: AC
  Administered 2022-08-11: 25 mg via INTRAVENOUS
  Filled 2022-08-11: qty 1

## 2022-08-11 MED ORDER — SODIUM CHLORIDE 0.9 % IV BOLUS
1000.0000 mL | Freq: Once | INTRAVENOUS | Status: AC
  Administered 2022-08-11: 1000 mL via INTRAVENOUS

## 2022-08-11 MED ORDER — KETOROLAC TROMETHAMINE 15 MG/ML IJ SOLN
15.0000 mg | Freq: Once | INTRAMUSCULAR | Status: AC
  Administered 2022-08-11: 15 mg via INTRAVENOUS
  Filled 2022-08-11: qty 1

## 2022-08-11 NOTE — ED Provider Notes (Signed)
[facility] Provider Note: Lowella Dell, MD, FACEP  CSN: [id number] MRN: [id number] ARRIVAL: 08/10/22 at 2135 ROOM: MH09/MH09   CHIEF COMPLAINT  Migraine   HISTORY OF PRESENT ILLNESS  08/11/22 1:10 AM Kendra Williams is a 22 y.o. female who has had a frontal headache for the past 10 days.  She was seen in the ED on 08/05/2022 and treated with a migraine cocktail (IV fluids, Toradol, prochlorperazine) with improvement and was discharged.  She states the headache returned the next day and has kept her from sleeping because she is scared.  She states she does not have a history of similar headaches.  She currently rates her headache as an 8 out of 10.  She is having some mild photophobia now less than she was having when she arrived.  She denies associated nausea or vomiting.   Past Medical History:  Diagnosis Date   Boil    Depression    DM (diabetes mellitus) (HCC)    Obesity    Seasonal allergies     History reviewed. No pertinent surgical history.  Family History  Problem Relation Age of Onset   Hypertension Paternal Grandmother    Obesity Father    Hypertension Father    Diabetes Father    Gout Father    Cancer Paternal Grandfather    Diabetes Paternal Grandfather    Diabetes Paternal Uncle    Diabetes Maternal Grandmother     Social History   Tobacco Use   Smoking status: Never    Passive exposure: Yes   Smokeless tobacco: Never  Vaping Use   Vaping Use: Former  Substance Use Topics   Alcohol use: No   Drug use: Yes    Types: Marijuana    Prior to Admission medications   Medication Sig Start Date End Date Taking? Authorizing Provider  aspirin-acetaminophen-caffeine (EXCEDRIN MIGRAINE) [phone] MG tablet Take 1 tablet by mouth every 6 (six) hours as needed for headache or migraine. 10/02/20   Wallis Bamberg, PA-C  fluconazole (DIFLUCAN) 150 MG tablet Take 1 tablet (150 mg total) by mouth daily. 04/17/22   Merrilee Jansky, MD  ibuprofen (ADVIL)  800 MG tablet Take 1 tablet (800 mg total) by mouth every 8 (eight) hours as needed. 07/21/20   Elson Areas, PA-C  lurasidone (LATUDA) 40 MG TABS tablet Take 1 tablet (40 mg total) by mouth daily with breakfast. 05/31/16   Starkes-Perry, Juel Burrow, FNP  metroNIDAZOLE (FLAGYL) 500 MG tablet Take 1 tablet (500 mg total) by mouth 2 (two) times daily. 04/17/22   Merrilee Jansky, MD  naproxen (NAPROSYN) 500 MG tablet Take 1 tablet (500 mg total) by mouth 2 (two) times daily with a meal. 04/15/22   Wallis Bamberg, PA-C  ondansetron (ZOFRAN-ODT) 8 MG disintegrating tablet Take 1 tablet (8 mg total) by mouth every 8 (eight) hours as needed for nausea or vomiting. 10/02/20   Wallis Bamberg, PA-C  sertraline (ZOLOFT) 25 MG tablet Take 3 tablets (75 mg total) by mouth daily. 05/31/16   Starkes-Perry, Juel Burrow, FNP  traZODone (DESYREL) 50 MG tablet Take 1 tablet (50 mg total) by mouth at bedtime as needed for sleep. 05/30/16   Starkes-Perry, Juel Burrow, FNP    Allergies Other and Peach [prunus persica]   REVIEW OF SYSTEMS  Negative except as noted here or in the History of Present Illness.   PHYSICAL EXAMINATION  Initial Vital Signs Blood pressure (!) 156/103, pulse 69, temperature 98.1 F (36.7 C), temperature source Oral,  resp. rate 16, height 5\' 6"  (1.676 m), weight 119 kg, last menstrual period 08/05/2022, SpO2 97 %.  Examination General: Well-developed, well-nourished female in no acute distress; appearance consistent with age of record HENT: normocephalic; atraumatic Eyes: pupils equal, round and reactive to light; extraocular muscles intact Neck: supple Heart: regular rate and rhythm Lungs: clear to auscultation bilaterally Abdomen: soft; nondistended; nontender; bowel sounds present Extremities: No deformity; full range of motion Neurologic: Awake, alert and oriented; motor function intact in all extremities and symmetric; no facial droop Skin: Warm and dry Psychiatric: Flat affect   RESULTS   Summary of this visit's results, reviewed and interpreted by myself:   EKG Interpretation  Date/Time:  Monday August 11 2022 01:24:01 EDT Ventricular Rate:  62 PR Interval:  143 QRS Duration: 90 QT Interval:  425 QTC Calculation: 432 R Axis:   76 Text Interpretation: Sinus rhythm Normal ECG Confirmed by Kameryn Tisdel ([id number]) on 08/11/2022 1:40:12 AM       Laboratory Studies: No results found for this or any previous visit (from the past 24 hour(s)). Imaging Studies: CT Head Wo Contrast  Result Date: 08/11/2022 CLINICAL DATA:  Headache, increasing frequency or severity. Migraine. EXAM: CT HEAD WITHOUT CONTRAST TECHNIQUE: Contiguous axial images were obtained from the base of the skull through the vertex without intravenous contrast. RADIATION DOSE REDUCTION: This exam was performed according to the departmental dose-optimization program which includes automated exposure control, adjustment of the mA and/or kV according to patient size and/or use of iterative reconstruction technique. COMPARISON:  None Available. FINDINGS: Brain: No acute intracranial hemorrhage, midline shift or mass effect. No extra-axial fluid collection. Gray-white matter differentiation is within normal limits. No hydrocephalus. Vascular: No hyperdense vessel or unexpected calcification. Skull: Normal. Negative for fracture or focal lesion. Sinuses/Orbits: Mild mucosal thickening in the maxillary sinuses bilaterally and ethmoid air cells on the left. No acute orbital abnormality. Other: None. IMPRESSION: No acute intracranial process. Electronically Signed   By: Thornell Sartorius M.D.   On: 08/11/2022 01:52    ED COURSE and MDM  Nursing notes, initial and subsequent vitals signs, including pulse oximetry, reviewed and interpreted by myself.  Vitals:   08/10/22 2143 08/10/22 2144  BP:  (!) 156/103  Pulse:  69  Resp:  16  Temp:  98.1 F (36.7 C)  TempSrc:  Oral  SpO2:  97%  Weight: 119 kg   Height: 5\' 6"  (1.676 m)     Medications  sodium chloride 0.9 % bolus 1,000 mL (1,000 mLs Intravenous New Bag/Given 08/11/22 0211)  droperidol (INAPSINE) 2.5 MG/ML injection 2.5 mg (2.5 mg Intravenous Given 08/11/22 0211)  diphenhydrAMINE (BENADRYL) injection 25 mg (25 mg Intravenous Given 08/11/22 0213)  ketorolac (TORADOL) 15 MG/ML injection 15 mg (15 mg Intravenous Given 08/11/22 0215)   3:11 AM Headache relieved with IV fluids and medications.  I suspect this represents status migrainosus.  She was about the age where migraines tend to start.  She declined a dose of dexamethasone even though it might help prevent recurrence.  We will refer to neurology for further evaluation.    PROCEDURES  Procedures   ED DIAGNOSES     ICD-10-CM   1. Status migrainosus  G43.901          Brynda Heick, MD 08/11/22 [phone]

## 2022-08-22 ENCOUNTER — Telehealth: Payer: Self-pay

## 2022-08-22 ENCOUNTER — Ambulatory Visit: Payer: Self-pay | Admitting: Family Medicine

## 2022-08-22 NOTE — Telephone Encounter (Signed)
NS on 7/5 no reason letter sent 

## 2022-09-15 ENCOUNTER — Encounter: Payer: Self-pay | Admitting: Neurology

## 2022-09-15 ENCOUNTER — Ambulatory Visit: Payer: Self-pay | Admitting: Neurology

## 2022-10-28 ENCOUNTER — Encounter: Payer: Self-pay | Admitting: Obstetrics and Gynecology

## 2022-11-19 ENCOUNTER — Emergency Department (HOSPITAL_COMMUNITY)
Admission: EM | Admit: 2022-11-19 | Discharge: 2022-11-19 | Discharge status: Against Medical Advice | Payer: MEDICAID | Attending: Emergency Medicine | Admitting: Emergency Medicine

## 2022-11-19 DIAGNOSIS — M791 Myalgia, unspecified site: Secondary | ICD-10-CM | POA: Insufficient documentation

## 2022-11-19 DIAGNOSIS — Z5321 Procedure and treatment not carried out due to patient leaving prior to being seen by health care provider: Secondary | ICD-10-CM | POA: Diagnosis not present

## 2022-11-19 NOTE — ED Notes (Signed)
Pt noted to walk out of lobby leaving the emergency room. Pt moved OTF.

## 2023-03-20 ENCOUNTER — Other Ambulatory Visit: Payer: Self-pay

## 2023-03-20 ENCOUNTER — Encounter (HOSPITAL_COMMUNITY): Payer: Self-pay

## 2023-03-20 ENCOUNTER — Emergency Department (HOSPITAL_COMMUNITY)
Admission: EM | Admit: 2023-03-20 | Discharge: 2023-03-21 | Discharge status: Against Medical Advice | Payer: MEDICAID | Attending: Emergency Medicine | Admitting: Emergency Medicine

## 2023-03-20 DIAGNOSIS — W010XXA Fall on same level from slipping, tripping and stumbling without subsequent striking against object, initial encounter: Secondary | ICD-10-CM | POA: Diagnosis not present

## 2023-03-20 DIAGNOSIS — S80911A Unspecified superficial injury of right knee, initial encounter: Secondary | ICD-10-CM | POA: Insufficient documentation

## 2023-03-20 DIAGNOSIS — Z5321 Procedure and treatment not carried out due to patient leaving prior to being seen by health care provider: Secondary | ICD-10-CM | POA: Diagnosis not present

## 2023-03-20 NOTE — ED Triage Notes (Signed)
POV ambulatory/ tripped over something at work injuring right knee/ happened @ 1830 today/ no other complaints/ pt is A&Ox4

## 2023-03-21 ENCOUNTER — Emergency Department (HOSPITAL_COMMUNITY): Payer: MEDICAID

## 2023-03-21 NOTE — ED Notes (Signed)
Called patients name to update vitals an it seems that patient has left.
